# Patient Record
Sex: Male | Born: 1970 | Race: White | Hispanic: No | Marital: Single
Health system: Southern US, Community
[De-identification: ages and names within clinical notes are randomized; demographics above are authoritative.]

---

## 2020-06-02 ENCOUNTER — Other Ambulatory Visit: Payer: Self-pay

## 2020-06-02 ENCOUNTER — Encounter (HOSPITAL_COMMUNITY): Payer: Self-pay | Admitting: Emergency Medicine

## 2020-06-02 ENCOUNTER — Emergency Department (HOSPITAL_COMMUNITY)
Admission: EM | Admit: 2020-06-02 | Discharge: 2020-06-02 | Disposition: A | Discharge status: Home / Self Care | Payer: Medicare Other | Attending: Emergency Medicine | Admitting: Emergency Medicine

## 2020-06-02 ENCOUNTER — Emergency Department (HOSPITAL_COMMUNITY): Payer: Medicare Other

## 2020-06-02 DIAGNOSIS — S91012A Laceration without foreign body, left ankle, initial encounter: Secondary | ICD-10-CM | POA: Insufficient documentation

## 2020-06-02 DIAGNOSIS — S91312A Laceration without foreign body, left foot, initial encounter: Secondary | ICD-10-CM | POA: Diagnosis present

## 2020-06-02 DIAGNOSIS — W270XXA Contact with workbench tool, initial encounter: Secondary | ICD-10-CM | POA: Diagnosis not present

## 2020-06-02 MED ORDER — CEFAZOLIN SODIUM-DEXTROSE 2-4 GM/100ML-% IV SOLN
2.0000 g | Freq: Once | INTRAVENOUS | Status: AC
  Administered 2020-06-02: 2 g via INTRAVENOUS
  Filled 2020-06-02: qty 100

## 2020-06-02 MED ORDER — CEPHALEXIN 500 MG PO CAPS: 500.0000 mg | ORAL_CAPSULE | Freq: Two times a day (BID) | ORAL | 0 refills | Status: AC

## 2020-06-02 MED ORDER — LIDOCAINE-EPINEPHRINE 1 %-1:100000 IJ SOLN
20.0000 mL | Freq: Once | INTRAMUSCULAR | Status: AC
  Administered 2020-06-02: 20 mL
  Filled 2020-06-02: qty 1

## 2020-06-02 NOTE — Progress Notes (Signed)
Orthopedic Tech Progress Note Patient Details:  Richard Wade 10/10/1970 [id number]  Ortho Devices Type of Ortho Device: Postop shoe/boot, Crutches Ortho Device/Splint Location: Left Foot Ortho Device/Splint Interventions: Application, Adjustment   Post Interventions Patient Tolerated: Well, Ambulated well Instructions Provided: Adjustment of device, Poper ambulation with device   Zooey Schreurs E Arshi Duarte 06/02/2020, 4:35 AM

## 2020-06-02 NOTE — ED Triage Notes (Signed)
Pt BIB [facility] from home after sustaining lacerations to left foot.   Pt states he was home using a hand saw to cut a pallet. Saw kicked back catching his left foot. Presents to ed with ankle laceration and anterior foot laceration to the left foot. Bleeding controlled. Given 150 mcg fent in route.

## 2020-06-02 NOTE — ED Provider Notes (Addendum)
[facility] EMERGENCY DEPARTMENT Provider Note   CSN: [id number] Arrival date & time: 06/02/20  0136     History Chief Complaint  Patient presents with  . Extremity Laceration    Richard Wade is a 49 y.o. male.  Patient presents to the emergency department with a chief complaint of left foot injury.  He states that he cut his foot with a circular saw tonight.  He sustained a laceration to the foot and to the ankle.  He denies numbness or weakness.  He states his pain is moderate.  He was given 150 mcg of fentanyl by EMS prior to arrival.  Patient states he does not want a tetanus shot.  Denies any other associated symptoms.  The history is provided by the patient. No language interpreter was used.       No past medical history on file.  There are no problems to display for this patient.   History reviewed. No pertinent surgical history.     No family history on file.  Social History   Tobacco Use  . Smoking status: Not on file  Substance Use Topics  . Alcohol use: Not on file  . Drug use: Not on file    Home Medications Prior to Admission medications   Not on File    Allergies    Patient has no allergy information on record.  Review of Systems   Review of Systems  All other systems reviewed and are negative.   Physical Exam Updated Vital Signs BP (!) 110/57   Pulse 72   Temp (!) 97.5 F (36.4 C)   Resp 19   Ht 5\' 8"  (1.727 m)   Wt 72.6 kg   SpO2 100%   BMI 24.33 kg/m   Physical Exam Nursing note and vitals reviewed.  Constitutional: Pt appears well-developed and well-nourished. No distress.  HENT:  Head: Normocephalic and atraumatic.  Eyes: Conjunctivae are normal.  Neck: Normal range of motion.  Cardiovascular: Normal rate, regular rhythm. Intact distal pulses.   Capillary refill < 3 sec.  Pulmonary/Chest: Effort normal and breath sounds normal.  Musculoskeletal:  Left ankle and foot Pt exhibits mild TTP over  the 1st metatarsal.   ROM: 4/5 limited by pain  Strength: 4/5 limited by pain  Neurological: Pt  is alert. Coordination normal.  Sensation: 5/5 Skin: 2 lacerations. (1) 3 cm laceration over the 1st metatarsal, no apparent bone or tendon involvement. (1) 6 cm laceration over the medial distal left lower leg just proximal to the ankle, extending through the skin, cutting through the periosteum vertically and extending into the medial malleolus. Psychiatric: Pt has a normal mood and affect.   ED Results / Procedures / Treatments   Labs (all labs ordered are listed, but only abnormal results are displayed) Labs Reviewed - No data to display  EKG EKG Interpretation  Date/Time:  Thursday June 02 2020 01:45:42 EST Ventricular Rate:  77 PR Interval:    QRS Duration: 101 QT Interval:  396 QTC Calculation: 449 R Axis:   76 Text Interpretation: Sinus rhythm Normal ECG No old tracing to compare Confirmed by [id number] (Dione Booze) on 06/02/2020 1:49:00 AM   Radiology DG Foot Complete Left  Result Date: 06/02/2020 CLINICAL DATA:  Trauma, left foot laceration EXAM: LEFT FOOT - COMPLETE 3+ VIEW COMPARISON:  None. FINDINGS: Three view radiograph left foot demonstrates normal alignment. No fracture or dislocation. Joint spaces are preserved. Soft tissue defect with subcutaneous gas is noted superficial to the  medial malleolus and medial to the first metatarsal head in keeping with the given history of soft tissue laceration. No retained radiopaque foreign body. IMPRESSION: Soft tissue injury.  No acute fracture or dislocation. Electronically Signed   By: Helyn Numbers MD   On: 06/02/2020 02:29    Procedures .Marland KitchenLaceration Repair  Date/Time: 06/02/2020 3:45 AM Performed by: Roxy Horseman, PA-C Authorized by: Roxy Horseman, PA-C   Consent:    Consent obtained:  Verbal   Consent given by:  Patient   Risks discussed:  Infection, need for additional repair, pain, poor cosmetic result and  poor wound healing   Alternatives discussed:  No treatment and delayed treatment Universal protocol:    Procedure explained and questions answered to patient or proxy's satisfaction: yes     Relevant documents present and verified: yes     Test results available and properly labeled: yes     Imaging studies available: yes     Required blood products, implants, devices, and special equipment available: yes     Site/side marked: yes     Immediately prior to procedure, a time out was called: yes     Patient identity confirmed:  Verbally with patient Anesthesia (see MAR for exact dosages):    Anesthesia method:  Local infiltration   Local anesthetic:  Lidocaine 1% WITH epi Laceration details:    Location:  Leg   Leg location:  L lower leg   Length (cm):  7 Repair type:    Repair type:  Simple Pre-procedure details:    Preparation:  Patient was prepped and draped in usual sterile fashion Exploration:    Hemostasis achieved with:  Direct pressure   Wound exploration: wound explored through full range of motion and entire depth of wound probed and visualized     Wound extent: underlying fracture   Treatment:    Area cleansed with:  Saline   Amount of cleaning:  Extensive   Irrigation solution:  Sterile saline   Irrigation method:  Syringe   Visualized foreign bodies/material removed: no   Skin repair:    Repair method:  Sutures   Suture size:  3-0   Suture material:  Prolene   Suture technique:  Running locked   Number of sutures:  10 Approximation:    Approximation:  Close Post-procedure details:    Dressing:  Antibiotic ointment and non-adherent dressing   Patient tolerance of procedure:  Tolerated well, no immediate complications .Marland KitchenLaceration Repair  Date/Time: 06/02/2020 3:46 AM Performed by: Roxy Horseman, PA-C Authorized by: Roxy Horseman, PA-C   Consent:    Consent obtained:  Verbal   Consent given by:  Patient   Risks discussed:  Infection, need for  additional repair, pain, poor cosmetic result and poor wound healing   Alternatives discussed:  No treatment and delayed treatment Universal protocol:    Procedure explained and questions answered to patient or proxy's satisfaction: yes     Relevant documents present and verified: yes     Test results available and properly labeled: yes     Imaging studies available: yes     Required blood products, implants, devices, and special equipment available: yes     Site/side marked: yes     Immediately prior to procedure, a time out was called: yes     Patient identity confirmed:  Verbally with patient Anesthesia (see MAR for exact dosages):    Anesthesia method:  Local infiltration Laceration details:    Location:  Foot   Foot location:  Top of L foot   Length (cm):  3 Repair type:    Repair type:  Simple Pre-procedure details:    Preparation:  Patient was prepped and draped in usual sterile fashion Exploration:    Hemostasis achieved with:  Direct pressure   Wound exploration: wound explored through full range of motion and entire depth of wound probed and visualized     Wound extent: no fascia violation noted, no foreign bodies/material noted, no muscle damage noted, no tendon damage noted, no underlying fracture noted and no vascular damage noted     Contaminated: no   Treatment:    Area cleansed with:  Saline   Amount of cleaning:  Standard   Irrigation solution:  Sterile saline   Irrigation method:  Syringe   Visualized foreign bodies/material removed: no   Skin repair:    Repair method:  Sutures   Suture size:  3-0   Suture material:  Prolene   Suture technique:  Running locked   Number of sutures:  6 Approximation:    Approximation:  Close Post-procedure details:    Dressing:  Antibiotic ointment and non-adherent dressing   Patient tolerance of procedure:  Tolerated well, no immediate complications   (including critical care time)  Medications Ordered in ED Medications   lidocaine-EPINEPHrine (XYLOCAINE W/EPI) 2 %-1:200000 (PF) injection 20 mL (has no administration in time range)    ED Course  I have reviewed the triage vital signs and the nursing notes.  Pertinent labs & imaging results that were available during my care of the patient were reviewed by me and considered in my medical decision making (see chart for details).    MDM Rules/Calculators/A&P                          Patient here with lacerations to the left foot after getting cut by a circular saw.  The lacerations initially appeared fairly superficial, but after irrigation it is clear the that longer laceration cuts into the medial malleolus with multiple bony fragments despite none seen on x-ray.  Dr. Preston Fleeting was asked to exam the ankle and agrees with my exam.  2g ancef ordered.  Will consult orthopedics.  3:24 AM Case discussed with Dr. Aundria Rud, who recommends thorough irrigation in the ED, IV ancef, and closure of skin in ED and placement in post-op shoe.  He will see the patient on Monday for a wound check.  Patient refused Tdap.   Final Clinical Impression(s) / ED Diagnoses Final diagnoses:  Laceration of left foot, initial encounter    Rx / DC Orders ED Discharge Orders    None       Roxy Horseman, PA-C 06/02/20 0349    Roxy Horseman, PA-C 06/02/20 0353    Roxy Horseman, PA-C 06/02/20 0426    Dione Booze, MD 06/02/20 [phone]

## 2020-06-02 NOTE — ED Notes (Signed)
Pt has refused to permit RN to unbandage left foot for assessment. Bleed controlled.

## 2021-11-16 IMAGING — DX DG FOOT COMPLETE 3+V*L*
3 series · 3 of 3 positions shown · non-contrast
Comparison: None.

CLINICAL DATA: Trauma, left foot laceration

EXAM:
LEFT FOOT - COMPLETE 3+ VIEW

[foot ap]
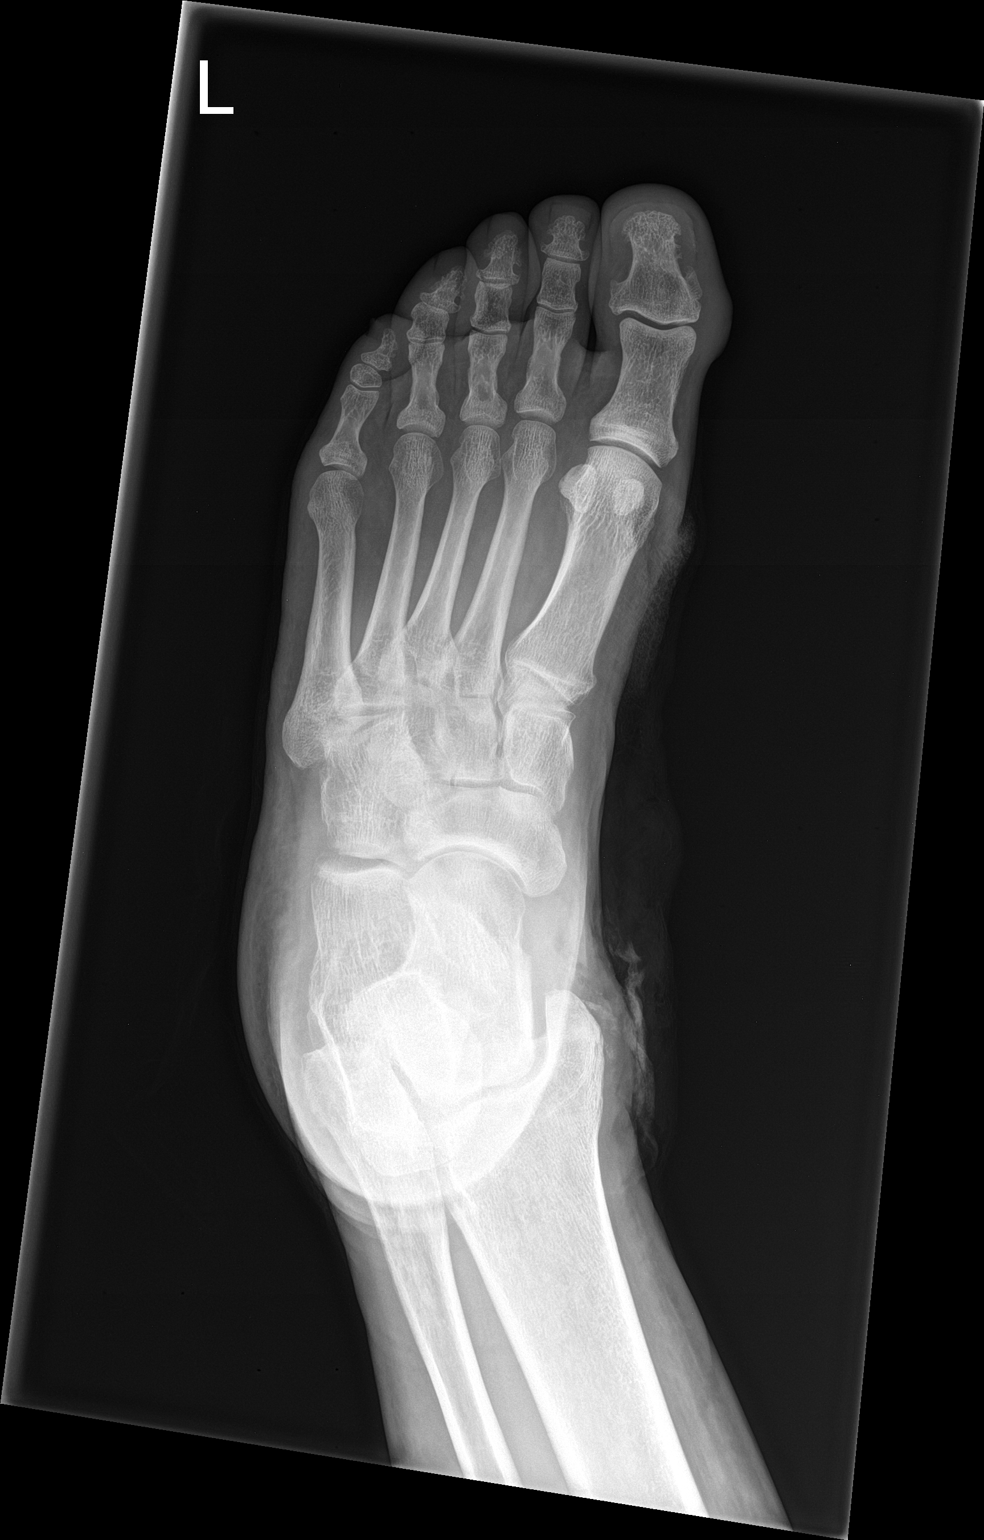

[foot obl]
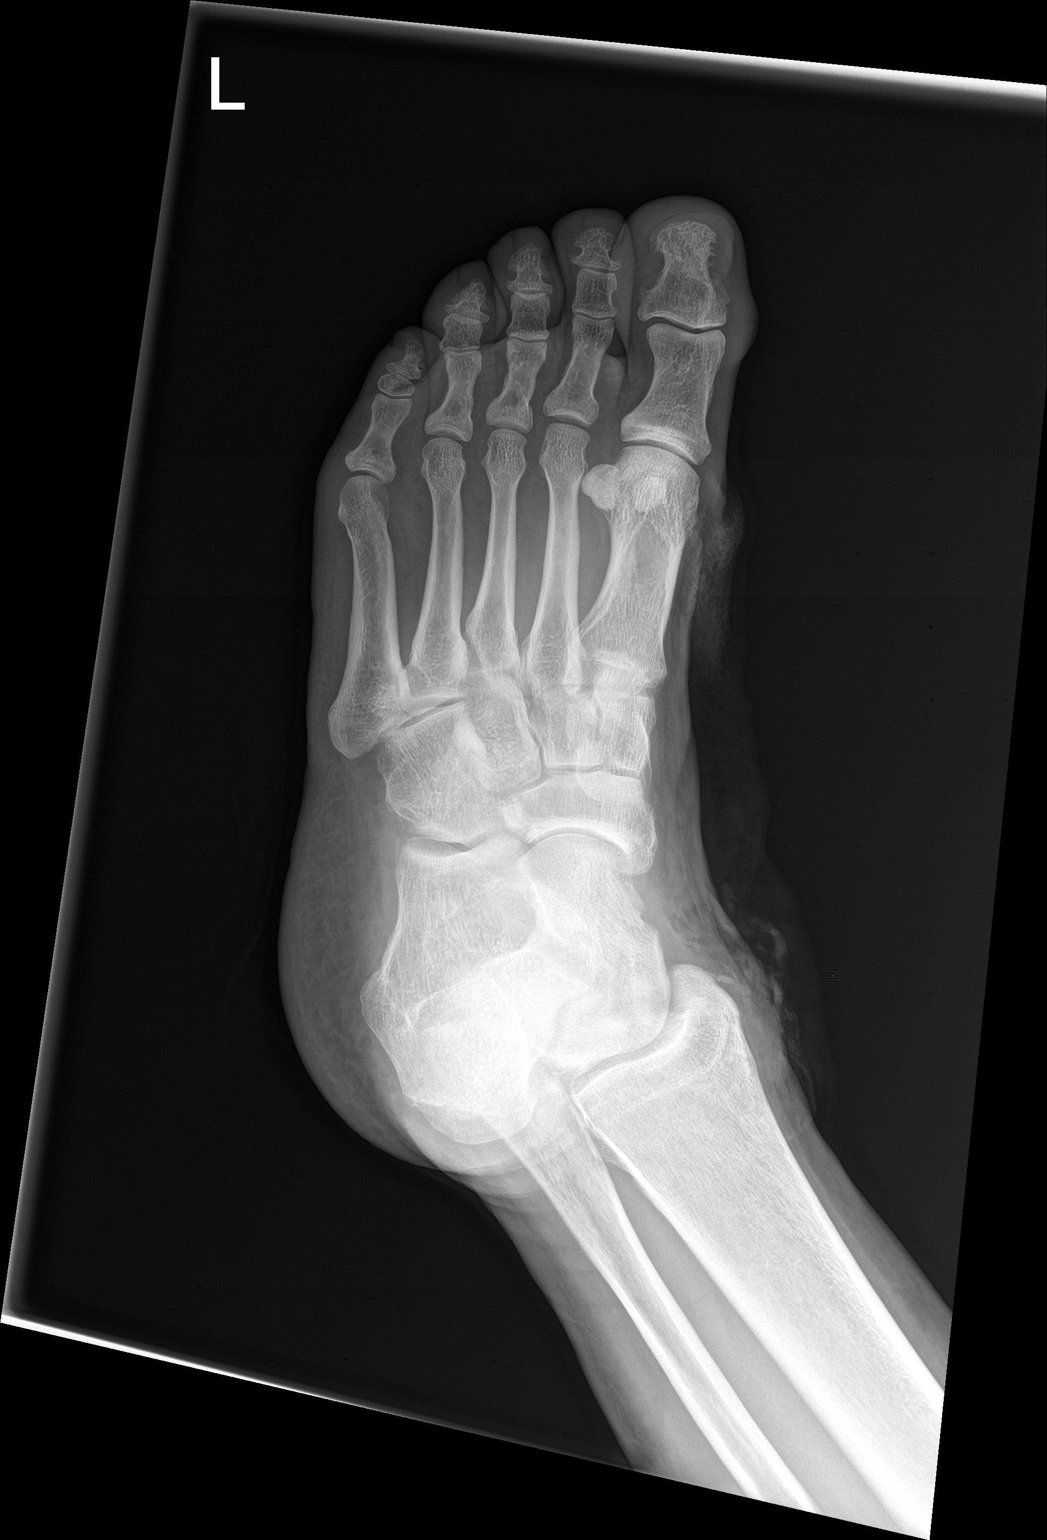

[foot lat]
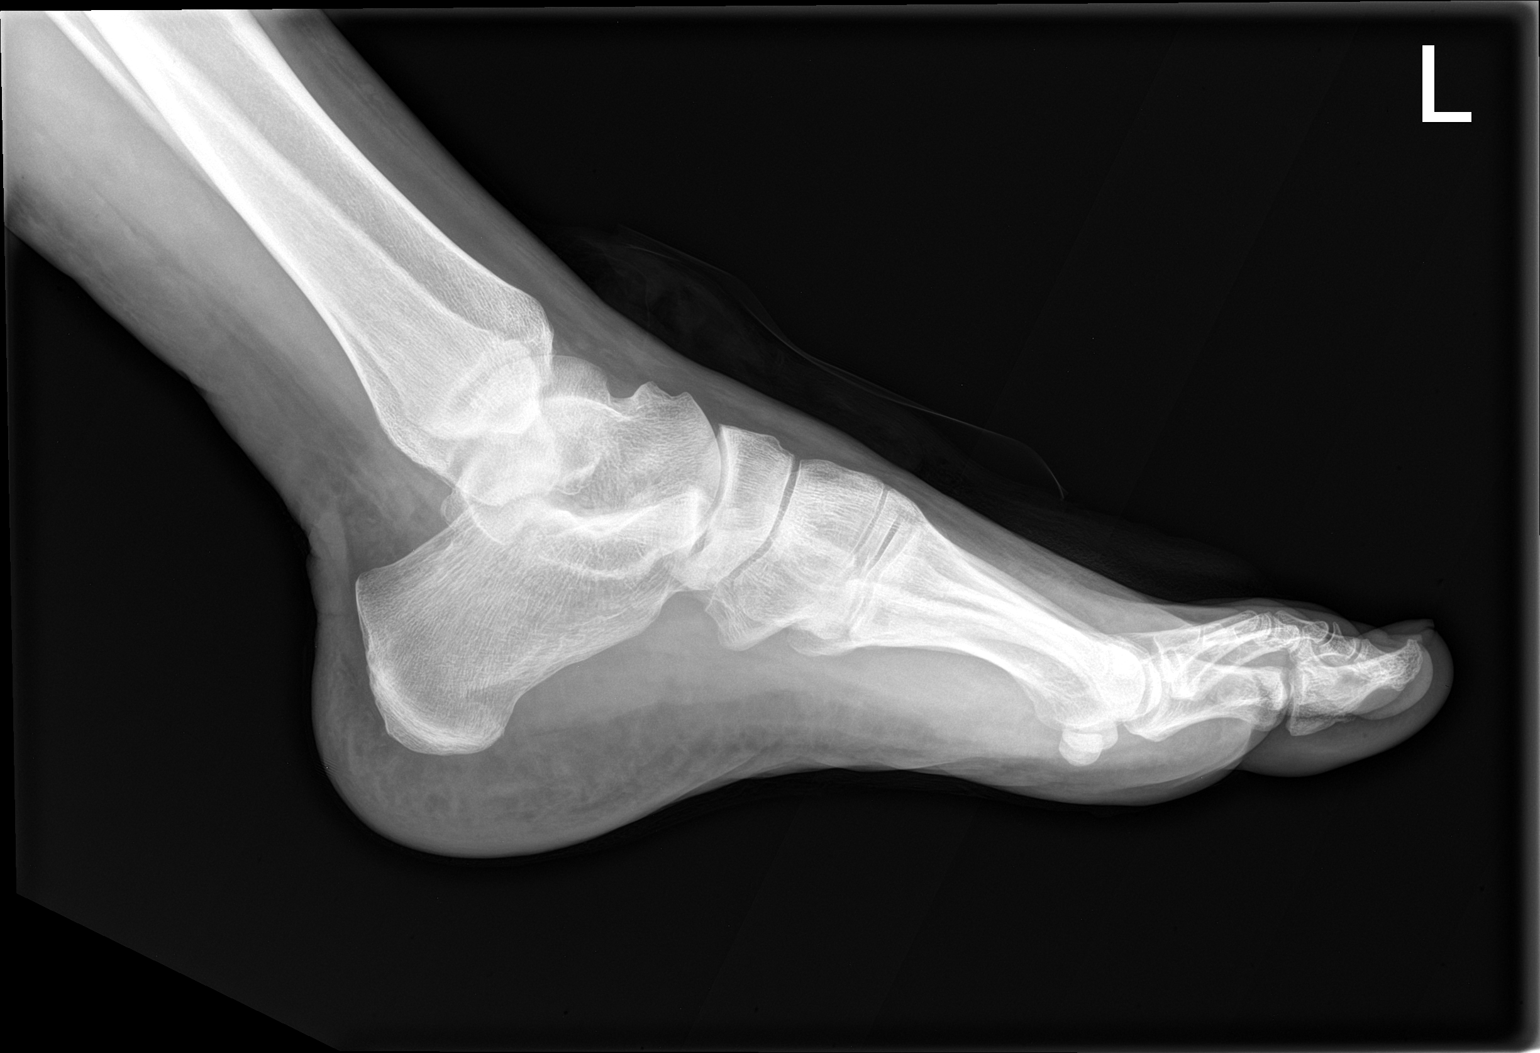

[3 of 3 positions shown; findings below may reference images not displayed]

FINDINGS: Three view radiograph left foot demonstrates normal alignment. No
fracture or dislocation. Joint spaces are preserved. Soft tissue
defect with subcutaneous gas is noted superficial to the medial
malleolus and medial to the first metatarsal head in keeping with
the given history of soft tissue laceration. No retained radiopaque
foreign body.
IMPRESSION: Soft tissue injury.  No acute fracture or dislocation.

## 2022-10-23 DIAGNOSIS — M5459 Other low back pain: Secondary | ICD-10-CM | POA: Diagnosis not present

## 2022-10-23 DIAGNOSIS — I10 Essential (primary) hypertension: Secondary | ICD-10-CM | POA: Diagnosis not present

## 2022-10-23 DIAGNOSIS — E039 Hypothyroidism, unspecified: Secondary | ICD-10-CM | POA: Diagnosis not present

## 2022-10-23 DIAGNOSIS — Z6827 Body mass index (BMI) 27.0-27.9, adult: Secondary | ICD-10-CM | POA: Diagnosis not present

## 2022-10-23 DIAGNOSIS — J4530 Mild persistent asthma, uncomplicated: Secondary | ICD-10-CM | POA: Diagnosis not present

## 2023-01-30 DIAGNOSIS — G894 Chronic pain syndrome: Secondary | ICD-10-CM | POA: Diagnosis not present

## 2023-01-30 DIAGNOSIS — E291 Testicular hypofunction: Secondary | ICD-10-CM | POA: Diagnosis not present

## 2023-01-30 DIAGNOSIS — Z1330 Encounter for screening examination for mental health and behavioral disorders, unspecified: Secondary | ICD-10-CM | POA: Diagnosis not present

## 2023-01-30 DIAGNOSIS — R072 Precordial pain: Secondary | ICD-10-CM | POA: Diagnosis not present

## 2023-01-30 DIAGNOSIS — J8410 Pulmonary fibrosis, unspecified: Secondary | ICD-10-CM | POA: Diagnosis not present

## 2023-01-31 DIAGNOSIS — E291 Testicular hypofunction: Secondary | ICD-10-CM | POA: Diagnosis not present

## 2023-05-08 DIAGNOSIS — R202 Paresthesia of skin: Secondary | ICD-10-CM | POA: Diagnosis not present

## 2023-05-08 DIAGNOSIS — S42024A Nondisplaced fracture of shaft of right clavicle, initial encounter for closed fracture: Secondary | ICD-10-CM | POA: Diagnosis not present

## 2023-05-08 DIAGNOSIS — R079 Chest pain, unspecified: Secondary | ICD-10-CM | POA: Diagnosis not present

## 2023-05-08 DIAGNOSIS — S299XXA Unspecified injury of thorax, initial encounter: Secondary | ICD-10-CM | POA: Diagnosis not present

## 2023-05-08 DIAGNOSIS — M25511 Pain in right shoulder: Secondary | ICD-10-CM | POA: Diagnosis not present

## 2023-05-08 DIAGNOSIS — M40204 Unspecified kyphosis, thoracic region: Secondary | ICD-10-CM | POA: Diagnosis not present

## 2023-05-08 DIAGNOSIS — Z981 Arthrodesis status: Secondary | ICD-10-CM | POA: Diagnosis not present

## 2023-05-16 DIAGNOSIS — S42024A Nondisplaced fracture of shaft of right clavicle, initial encounter for closed fracture: Secondary | ICD-10-CM | POA: Diagnosis not present

## 2023-05-16 DIAGNOSIS — R292 Abnormal reflex: Secondary | ICD-10-CM | POA: Diagnosis not present

## 2023-05-16 DIAGNOSIS — M25512 Pain in left shoulder: Secondary | ICD-10-CM | POA: Diagnosis not present

## 2023-05-16 DIAGNOSIS — J8410 Pulmonary fibrosis, unspecified: Secondary | ICD-10-CM | POA: Diagnosis not present

## 2023-05-16 DIAGNOSIS — M5412 Radiculopathy, cervical region: Secondary | ICD-10-CM | POA: Diagnosis not present

## 2023-05-16 DIAGNOSIS — G894 Chronic pain syndrome: Secondary | ICD-10-CM | POA: Diagnosis not present

## 2023-05-16 DIAGNOSIS — M5030 Other cervical disc degeneration, unspecified cervical region: Secondary | ICD-10-CM | POA: Diagnosis not present
